# Patient Record
Sex: Female | Born: 1995
Health system: Southern US, Community
[De-identification: ages and names within clinical notes are randomized; demographics above are authoritative.]

## PROBLEM LIST (undated history)

## (undated) DIAGNOSIS — J45909 Unspecified asthma, uncomplicated: Secondary | ICD-10-CM

---

## 2013-06-24 ENCOUNTER — Encounter (HOSPITAL_COMMUNITY): Payer: Self-pay | Admitting: Adult Health

## 2013-06-24 DIAGNOSIS — F17200 Nicotine dependence, unspecified, uncomplicated: Secondary | ICD-10-CM | POA: Insufficient documentation

## 2013-06-24 DIAGNOSIS — Z3202 Encounter for pregnancy test, result negative: Secondary | ICD-10-CM | POA: Insufficient documentation

## 2013-06-24 DIAGNOSIS — R109 Unspecified abdominal pain: Secondary | ICD-10-CM | POA: Insufficient documentation

## 2013-06-24 DIAGNOSIS — J45909 Unspecified asthma, uncomplicated: Secondary | ICD-10-CM | POA: Insufficient documentation

## 2013-06-24 LAB — URINE MICROSCOPIC-ADD ON

## 2013-06-24 LAB — URINALYSIS, ROUTINE W REFLEX MICROSCOPIC
Glucose, UA: NEGATIVE mg/dL
Hgb urine dipstick: NEGATIVE
Ketones, ur: NEGATIVE mg/dL
Protein, ur: NEGATIVE mg/dL
pH: 7 (ref 5.0–8.0)

## 2013-06-24 NOTE — ED Notes (Signed)
Presents with bilateral lower quadrant abdominal pain that is intermittent for one week. Denies vaginal odor and discharge, reports difficulty urinating. Denies nausea and vomiting. LMP unknown

## 2013-06-25 ENCOUNTER — Emergency Department (HOSPITAL_COMMUNITY)
Admission: EM | Admit: 2013-06-25 | Discharge: 2013-06-25 | Disposition: A | Discharge status: Home / Self Care | Payer: Medicaid Other | Attending: Emergency Medicine | Admitting: Emergency Medicine

## 2013-06-25 DIAGNOSIS — R1030 Lower abdominal pain, unspecified: Secondary | ICD-10-CM

## 2013-06-25 HISTORY — DX: Unspecified asthma, uncomplicated: J45.909

## 2013-06-25 LAB — PREGNANCY, URINE: Preg Test, Ur: NEGATIVE

## 2013-06-25 NOTE — ED Notes (Signed)
Patient presents with a 1 week hx intermittent lower abdominal pain. Denies n/v, diarrhea, constipation, dysuria, hematuria, vaginal discharge or odor. No urinary frequency, urgency or hesitancy. Last Depo injection 4 months ago. LMP July, pt unsure/unable to approximate a date. Has not had menstrual cycle for the month of August. Patient did not have scheduled depo last month and has decided not to. Currently not on any birth control.

## 2013-06-25 NOTE — ED Provider Notes (Signed)
Medical screening examination/treatment/procedure(s) were performed by non-physician practitioner and as supervising physician I was immediately available for consultation/collaboration.  Sunnie Nielsen, MD 06/25/13 [phone]

## 2013-06-25 NOTE — ED Provider Notes (Signed)
CSN: [id number]     Arrival date & time 06/24/13  2101 History   First MD Initiated Contact with Patient 06/25/13 0103     Chief Complaint  Patient presents with  . Abdominal Pain   (Consider location/radiation/quality/duration/timing/severity/associated sxs/prior Treatment) HPI Comments: Patient reports, that she has a short stabbing pain in her suprapubic area 2-3 times a day, lasting for a second or 2 for the past, week.  Denies any vaginal discharge, dysuria, constipation, nausea, vomiting, recent illnesses, states, that she had a normal pelvic exam 2, weeks, ago by her primary care physician.  She has opted not to use any birth control.  Other than condoms.  She has not concern for an STD  Patient is a 17 y.o. female presenting with abdominal pain. The history is provided by the patient.  Abdominal Pain Pain location:  Suprapubic Pain quality: sharp   Pain radiates to:  Does not radiate Pain severity:  No pain Onset quality:  Unable to specify Duration:  1 week Timing:  Sporadic Chronicity:  New Context: not awakening from sleep, not diet changes, not eating, not laxative use, not medication withdrawal, not previous surgeries, not recent illness, not recent sexual activity, not recent travel, not retching, not sick contacts, not suspicious food intake and not trauma   Relieved by:  None tried Associated symptoms: no constipation, no diarrhea, no dysuria, no fever, no nausea, no vaginal discharge and no vomiting     Past Medical History  Diagnosis Date  . Asthma    History reviewed. No pertinent past surgical history. History reviewed. No pertinent family history. History  Substance Use Topics  . Smoking status: Current Every Day Smoker    Types: Cigarettes  . Smokeless tobacco: Not on file  . Alcohol Use: No   OB History   Grav Para Term Preterm Abortions TAB SAB Ect Mult Living                 Review of Systems  Constitutional: Negative for fever.  Gastrointestinal:  Positive for abdominal pain. Negative for nausea, vomiting, diarrhea, constipation and blood in stool.  Genitourinary: Negative for dysuria, urgency, frequency, flank pain, decreased urine volume, vaginal discharge, genital sores, menstrual problem, pelvic pain and dyspareunia.  Musculoskeletal: Negative for myalgias.  All other systems reviewed and are negative.    Allergies  Shellfish allergy  Home Medications  No current outpatient prescriptions on file. BP 101/69  Pulse 61  Temp(Src) 98.3 F (36.8 C) (Oral)  Resp 14  SpO2 100% Physical Exam  Nursing note and vitals reviewed. Constitutional: She appears well-developed and well-nourished.  HENT:  Head: Normocephalic.  Eyes: Pupils are equal, round, and reactive to light.  Neck: Normal range of motion.  Cardiovascular: Normal rate.   Pulmonary/Chest: Effort normal.  Abdominal: Soft. Bowel sounds are normal. She exhibits no distension. There is tenderness in the suprapubic area.    Neurological: She is alert.  Skin: Skin is warm.    ED Course  Procedures (including critical care time) Labs Review Labs Reviewed  URINALYSIS, ROUTINE W REFLEX MICROSCOPIC - Abnormal; Notable for the following:    Leukocytes, UA MODERATE (*)    All other components within normal limits  URINE MICROSCOPIC-ADD ON - Abnormal; Notable for the following:    Squamous Epithelial / LPF FEW (*)    All other components within normal limits  PREGNANCY, URINE   Imaging Review No results found.  MDM   1. Suprapubic pain, unspecified laterality    Does  not appear ill or toxic in nature.  Vital signs are stable.  She is afebrile Reviewed.  Normal urine with patient offered pelvic exam, she, states she would rather followup with her primary care physician    Arman Filter, NP 06/25/13 0153

## 2014-05-21 ENCOUNTER — Encounter (HOSPITAL_BASED_OUTPATIENT_CLINIC_OR_DEPARTMENT_OTHER): Payer: Self-pay | Admitting: Emergency Medicine

## 2014-05-21 ENCOUNTER — Emergency Department (HOSPITAL_BASED_OUTPATIENT_CLINIC_OR_DEPARTMENT_OTHER)
Admission: EM | Admit: 2014-05-21 | Discharge: 2014-05-21 | Discharge status: Against Medical Advice | Payer: Medicaid Other | Attending: Emergency Medicine | Admitting: Emergency Medicine

## 2014-05-21 DIAGNOSIS — O9989 Other specified diseases and conditions complicating pregnancy, childbirth and the puerperium: Secondary | ICD-10-CM | POA: Insufficient documentation

## 2014-05-21 DIAGNOSIS — F17200 Nicotine dependence, unspecified, uncomplicated: Secondary | ICD-10-CM | POA: Insufficient documentation

## 2014-05-21 DIAGNOSIS — J45909 Unspecified asthma, uncomplicated: Secondary | ICD-10-CM | POA: Diagnosis not present

## 2014-05-21 DIAGNOSIS — R109 Unspecified abdominal pain: Secondary | ICD-10-CM | POA: Diagnosis not present

## 2014-05-21 LAB — URINALYSIS, ROUTINE W REFLEX MICROSCOPIC
Bilirubin Urine: NEGATIVE
GLUCOSE, UA: NEGATIVE mg/dL
Ketones, ur: NEGATIVE mg/dL
LEUKOCYTES UA: NEGATIVE
Nitrite: NEGATIVE
PH: 6.5 (ref 5.0–8.0)
Protein, ur: NEGATIVE mg/dL
Specific Gravity, Urine: 1.011 (ref 1.005–1.030)
Urobilinogen, UA: 2 mg/dL — ABNORMAL HIGH (ref 0.0–1.0)

## 2014-05-21 LAB — URINE MICROSCOPIC-ADD ON

## 2014-05-21 LAB — PREGNANCY, URINE: PREG TEST UR: NEGATIVE

## 2014-05-21 NOTE — ED Notes (Signed)
Pt called to be taken to exam room with no answer. Pt not found in waiting room, cantina area, or waiting room bathrooms.

## 2014-05-21 NOTE — ED Notes (Signed)
Checked waiting area again for pt to take her to exam room. Pt not located.

## 2014-05-21 NOTE — ED Notes (Signed)
Pt recently discovered she is pregnant. At apprx. 1:00 am today, she began having lower abd pain and vaginal bleeding.

## 2014-08-14 ENCOUNTER — Emergency Department (HOSPITAL_BASED_OUTPATIENT_CLINIC_OR_DEPARTMENT_OTHER)
Admission: EM | Admit: 2014-08-14 | Discharge: 2014-08-14 | Disposition: A | Discharge status: Home / Self Care | Payer: Medicaid Other | Attending: Emergency Medicine | Admitting: Emergency Medicine

## 2014-08-14 ENCOUNTER — Emergency Department (HOSPITAL_BASED_OUTPATIENT_CLINIC_OR_DEPARTMENT_OTHER): Payer: Medicaid Other

## 2014-08-14 ENCOUNTER — Encounter (HOSPITAL_BASED_OUTPATIENT_CLINIC_OR_DEPARTMENT_OTHER): Payer: Self-pay | Admitting: Emergency Medicine

## 2014-08-14 DIAGNOSIS — J45909 Unspecified asthma, uncomplicated: Secondary | ICD-10-CM | POA: Insufficient documentation

## 2014-08-14 DIAGNOSIS — Z791 Long term (current) use of non-steroidal anti-inflammatories (NSAID): Secondary | ICD-10-CM | POA: Insufficient documentation

## 2014-08-14 DIAGNOSIS — M79641 Pain in right hand: Secondary | ICD-10-CM | POA: Diagnosis present

## 2014-08-14 DIAGNOSIS — Z720 Tobacco use: Secondary | ICD-10-CM | POA: Diagnosis not present

## 2014-08-14 LAB — CBC
HEMATOCRIT: 38.7 % (ref 36.0–46.0)
HEMOGLOBIN: 12.5 g/dL (ref 12.0–15.0)
MCH: 30.2 pg (ref 26.0–34.0)
MCHC: 32.3 g/dL (ref 30.0–36.0)
MCV: 93.5 fL (ref 78.0–100.0)
Platelets: 263 10*3/uL (ref 150–400)
RBC: 4.14 MIL/uL (ref 3.87–5.11)
RDW: 13.2 % (ref 11.5–15.5)
WBC: 7.7 10*3/uL (ref 4.0–10.5)

## 2014-08-14 LAB — SEDIMENTATION RATE: SED RATE: 5 mm/h (ref 0–22)

## 2014-08-14 LAB — BASIC METABOLIC PANEL
Anion gap: 15 (ref 5–15)
BUN: 15 mg/dL (ref 6–23)
CALCIUM: 9.3 mg/dL (ref 8.4–10.5)
CO2: 23 meq/L (ref 19–32)
CREATININE: 0.7 mg/dL (ref 0.50–1.10)
Chloride: 102 mEq/L (ref 96–112)
GFR calc Af Amer: >90 mL/min (ref >90)
GLUCOSE: 89 mg/dL (ref 70–99)
Potassium: 3.9 mEq/L (ref 3.7–5.3)
SODIUM: 140 meq/L (ref 137–147)

## 2014-08-14 MED ORDER — IBUPROFEN 800 MG PO TABS: 800.0000 mg | ORAL_TABLET | Freq: Three times a day (TID) | ORAL | Status: DC

## 2014-08-14 NOTE — Discharge Instructions (Signed)
Take ibuprofen as directed. Keep your hand elevated and ice.  Musculoskeletal Pain Musculoskeletal pain is muscle and boney aches and pains. These pains can occur in any part of the body. Your caregiver may treat you without knowing the cause of the pain. They may treat you if blood or urine tests, X-rays, and other tests were normal.  CAUSES There is often not a definite cause or reason for these pains. These pains may be caused by a type of germ (virus). The discomfort may also come from overuse. Overuse includes working out too hard when your body is not fit. Boney aches also come from weather changes. Bone is sensitive to atmospheric pressure changes. HOME CARE INSTRUCTIONS   Ask when your test results will be ready. Make sure you get your test results.  Only take over-the-counter or prescription medicines for pain, discomfort, or fever as directed by your caregiver. If you were given medications for your condition, do not drive, operate machinery or power tools, or sign legal documents for 24 hours. Do not drink alcohol. Do not take sleeping pills or other medications that may interfere with treatment.  Continue all activities unless the activities cause more pain. When the pain lessens, slowly resume normal activities. Gradually increase the intensity and duration of the activities or exercise.  During periods of severe pain, bed rest may be helpful. Lay or sit in any position that is comfortable.  Putting ice on the injured area.  Put ice in a bag.  Place a towel between your skin and the bag.  Leave the ice on for 15 to 20 minutes, 3 to 4 times a day.  Follow up with your caregiver for continued problems and no reason can be found for the pain. If the pain becomes worse or does not go away, it may be necessary to repeat tests or do additional testing. Your caregiver may need to look further for a possible cause. SEEK IMMEDIATE MEDICAL CARE IF:  You have pain that is getting worse  and is not relieved by medications.  You develop chest pain that is associated with shortness or breath, sweating, feeling sick to your stomach (nauseous), or throw up (vomit).  Your pain becomes localized to the abdomen.  You develop any new symptoms that seem different or that concern you. MAKE SURE YOU:   Understand these instructions.  Will watch your condition.  Will get help right away if you are not doing well or get worse. Document Released: 10/14/2005 Document Revised: 01/06/2012 Document Reviewed: 06/18/2013 [facility] Patient Information 2015 [employer], [place]. This information is not intended to replace advice given to you by your health care provider. Make sure you discuss any questions you have with your health care provider. RICE: Routine Care for Injuries The routine care of many injuries includes Rest, Ice, Compression, and Elevation (RICE). HOME CARE INSTRUCTIONS  Rest is needed to allow your body to heal. Routine activities can usually be resumed when comfortable. Injured tendons and bones can take up to 6 weeks to heal. Tendons are the cord-like structures that attach muscle to bone.  Ice following an injury helps keep the swelling down and reduces pain.  Put ice in a plastic bag.  Place a towel between your skin and the bag.  Leave the ice on for 15-20 minutes, 3-4 times a day, or as directed by your health care provider. Do this while awake, for the first 24 to 48 hours. After that, continue as directed by your caregiver.  Compression  helps keep swelling down. It also gives support and helps with discomfort. If an elastic bandage has been applied, it should be removed and reapplied every 3 to 4 hours. It should not be applied tightly, but firmly enough to keep swelling down. Watch fingers or toes for swelling, bluish discoloration, coldness, numbness, or excessive pain. If any of these problems occur, remove the bandage and reapply loosely. Contact your caregiver if  these problems continue.  Elevation helps reduce swelling and decreases pain. With extremities, such as the arms, hands, legs, and feet, the injured area should be placed near or above the level of the heart, if possible. SEEK IMMEDIATE MEDICAL CARE IF:  You have persistent pain and swelling.  You develop redness, numbness, or unexpected weakness.  Your symptoms are getting worse rather than improving after several days. These symptoms may indicate that further evaluation or further X-rays are needed. Sometimes, X-rays may not show a small broken bone (fracture) until 1 week or 10 days later. Make a follow-up appointment with your caregiver. Ask when your X-ray results will be ready. Make sure you get your X-ray results. Document Released: 01/26/2001 Document Revised: 10/19/2013 Document Reviewed: 03/15/2011 [facility] Patient Information 2015 [place], [place]. This information is not intended to replace advice given to you by your health care provider. Make sure you discuss any questions you have with your health care provider.

## 2014-08-14 NOTE — ED Notes (Signed)
PA at bedside.

## 2014-08-14 NOTE — ED Provider Notes (Signed)
CSN: [id number]     Arrival date & time 08/14/14  1529 History   First MD Initiated Contact with Patient 08/14/14 1556     Chief Complaint  Patient presents with  . Hand Pain     (Consider location/radiation/quality/duration/timing/severity/associated sxs/prior Treatment) HPI Comments: This is a 18 year old female with a past medical history of asthma who presents to the emergency department complaining of right hand swelling, pain weakness x2 days. Patient reports throughout the day 2 days ago she started to notice her hand is becoming weaker, and increasingly swollen. Pain worse when she touches her hand or if she tries to make a fist. She tried taking ibuprofen with minimal relief. No known injury or trauma. Denies fever, chills, wounds or skin color change.  Patient is a 18 y.o. female presenting with hand pain. The history is provided by the patient.  Hand Pain    Past Medical History  Diagnosis Date  . Asthma    History reviewed. No pertinent past surgical history. No family history on file. History  Substance Use Topics  . Smoking status: Current Every Day Smoker    Types: Cigarettes  . Smokeless tobacco: Not on file  . Alcohol Use: No   OB History   Grav Para Term Preterm Abortions TAB SAB Ect Mult Living                 Review of Systems 10 Systems reviewed and are negative for acute change except as noted in the HPI.    Allergies  Shellfish allergy  Home Medications   Prior to Admission medications   Medication Sig Start Date End Date Taking? Authorizing Provider  ibuprofen (ADVIL,MOTRIN) 800 MG tablet Take 1 tablet (800 mg total) by mouth 3 (three) times daily. 08/14/14   Keamber Macfadden M Fulton Merry, PA-C   BP 114/55  Pulse 60  Temp(Src) 98 F (36.7 C) (Oral)  Resp 20  Ht 5\' 6"  (1.676 m)  SpO2 100%  LMP 07/19/2014 Physical Exam  Nursing note and vitals reviewed. Constitutional: She is oriented to person, place, and time. She appears well-developed and  well-nourished. No distress.  HENT:  Head: Normocephalic and atraumatic.  Mouth/Throat: Oropharynx is clear and moist.  Eyes: Conjunctivae and EOM are normal.  Neck: Normal range of motion. Neck supple.  Cardiovascular: Normal rate, regular rhythm and normal heart sounds.   Pulmonary/Chest: Effort normal and breath sounds normal. No respiratory distress.  Musculoskeletal:  R hand with mild edema throughout, most prominent over 2-5 MCP. Tender. No erythema or warmth. ROM limited by pain to MCPs. Cap refill < 3 seconds. Sensation intact.  Neurological: She is alert and oriented to person, place, and time. No sensory deficit.  Skin: Skin is warm and dry.  Psychiatric: She has a normal mood and affect. Her behavior is normal.    ED Course  Procedures (including critical care time) Labs Review Labs Reviewed  CBC  BASIC METABOLIC PANEL  SEDIMENTATION RATE    Imaging Review Dg Hand Complete Right  08/14/2014   CLINICAL DATA:  Posterior right hand pain and swelling. No known injury. Initial encounter.  EXAM: RIGHT HAND - COMPLETE 3+ VIEW  COMPARISON:  None.  FINDINGS: Imaged bones, joints and soft tissues appear normal.  IMPRESSION: Negative exam.   Electronically Signed   By: Drusilla Kannerhomas  Dalessio M.D.   On: 08/14/2014 16:15     EKG Interpretation None      MDM   Final diagnoses:  Right hand pain   Pt non-toxic  appearing and in NAD. AFVSS. Neurovascularly intact. No injury or trauma. No erythema or warmth concerning for infection. No leukocytosis, normal sed rate. Advised RICE, NSAIDs. velcro splint applied. Resources given for f/u. Stable for d/c. Return precautions given. Patient states understanding of treatment care plan and is agreeable.   Kathrynn SpeedRobyn M Lorrinda Ramstad, PA-C 08/14/14 1810

## 2014-08-14 NOTE — ED Notes (Addendum)
Right hand swollen and weak. Denies injury

## 2014-08-16 NOTE — ED Provider Notes (Signed)
Medical screening examination/treatment/procedure(s) were performed by non-physician practitioner and as supervising physician I was immediately available for consultation/collaboration.   EKG Interpretation None       Arby BarretteMarcy Bernadetta Roell, MD 08/16/14 [phone]

## 2015-11-04 ENCOUNTER — Encounter (HOSPITAL_BASED_OUTPATIENT_CLINIC_OR_DEPARTMENT_OTHER): Payer: Self-pay

## 2015-11-04 ENCOUNTER — Emergency Department (HOSPITAL_BASED_OUTPATIENT_CLINIC_OR_DEPARTMENT_OTHER)
Admission: EM | Admit: 2015-11-04 | Discharge: 2015-11-04 | Disposition: A | Discharge status: Home / Self Care | Payer: Medicaid Other | Attending: Emergency Medicine | Admitting: Emergency Medicine

## 2015-11-04 DIAGNOSIS — J45909 Unspecified asthma, uncomplicated: Secondary | ICD-10-CM | POA: Diagnosis not present

## 2015-11-04 DIAGNOSIS — F17210 Nicotine dependence, cigarettes, uncomplicated: Secondary | ICD-10-CM | POA: Insufficient documentation

## 2015-11-04 DIAGNOSIS — X58XXXA Exposure to other specified factors, initial encounter: Secondary | ICD-10-CM | POA: Insufficient documentation

## 2015-11-04 DIAGNOSIS — R1110 Vomiting, unspecified: Secondary | ICD-10-CM | POA: Diagnosis not present

## 2015-11-04 DIAGNOSIS — Y998 Other external cause status: Secondary | ICD-10-CM | POA: Insufficient documentation

## 2015-11-04 DIAGNOSIS — Y9389 Activity, other specified: Secondary | ICD-10-CM | POA: Diagnosis not present

## 2015-11-04 DIAGNOSIS — Z3202 Encounter for pregnancy test, result negative: Secondary | ICD-10-CM | POA: Diagnosis not present

## 2015-11-04 DIAGNOSIS — Y9289 Other specified places as the place of occurrence of the external cause: Secondary | ICD-10-CM | POA: Diagnosis not present

## 2015-11-04 DIAGNOSIS — T192XXA Foreign body in vulva and vagina, initial encounter: Secondary | ICD-10-CM | POA: Diagnosis present

## 2015-11-04 DIAGNOSIS — Z791 Long term (current) use of non-steroidal anti-inflammatories (NSAID): Secondary | ICD-10-CM | POA: Diagnosis not present

## 2015-11-04 LAB — URINALYSIS, ROUTINE W REFLEX MICROSCOPIC
GLUCOSE, UA: NEGATIVE mg/dL
KETONES UR: 15 mg/dL — AB
LEUKOCYTES UA: NEGATIVE
Nitrite: NEGATIVE
PH: 6 (ref 5.0–8.0)
PROTEIN: NEGATIVE mg/dL
Specific Gravity, Urine: 1.028 (ref 1.005–1.030)

## 2015-11-04 LAB — URINE MICROSCOPIC-ADD ON

## 2015-11-04 LAB — WET PREP, GENITAL
SPERM: NONE SEEN
TRICH WET PREP: NONE SEEN
YEAST WET PREP: NONE SEEN

## 2015-11-04 LAB — PREGNANCY, URINE: Preg Test, Ur: NEGATIVE

## 2015-11-04 MED ORDER — ONDANSETRON HCL 4 MG/2ML IJ SOLN
4.0000 mg | Freq: Once | INTRAMUSCULAR | Status: AC
  Administered 2015-11-04: 4 mg via INTRAVENOUS
  Filled 2015-11-04: qty 2

## 2015-11-04 MED ORDER — SODIUM CHLORIDE 0.9 % IV BOLUS (SEPSIS)
1000.0000 mL | Freq: Once | INTRAVENOUS | Status: AC
  Administered 2015-11-04: 1000 mL via INTRAVENOUS

## 2015-11-04 MED ORDER — ONDANSETRON 8 MG PO TBDP: 8.0000 mg | ORAL_TABLET | Freq: Three times a day (TID) | ORAL | Status: AC | PRN

## 2015-11-04 MED ORDER — METRONIDAZOLE 500 MG PO TABS: 500.0000 mg | ORAL_TABLET | Freq: Two times a day (BID) | ORAL | Status: DC

## 2015-11-04 NOTE — Discharge Instructions (Signed)
Vaginal Foreign Body °A vaginal foreign body is any object that gets stuck or left inside the vagina. Vaginal foreign bodies left in the vagina for a long time can cause irritation and infection. In most cases, symptoms go away once the vaginal foreign body is found and removed. Rarely, a foreign object can break through the walls of the vagina and cause a serious infection inside the abdomen.  °CAUSES  °The most common vaginal foreign bodies are: °· Tampons. °· Contraceptive devices. °· Toilet tissue left in the vagina. °· Small objects that were placed in the vagina out of curiosity and got stuck. °· A result of sexual abuse.  °SIGNS AND SYMPTOMS °· Light vaginal bleeding. °· Blood-tinged vaginal fluid (discharge). °· Vaginal discharge that smells bad. °· Vaginal itching or burning. °· Redness, swelling, or rash near the opening of the vagina. °· Abdominal pain. °· Fever. °· Burning or frequent urination. °DIAGNOSIS  °Your health care provider may be able to diagnose a vaginal foreign body based on the information you provide, your symptoms, and a physical exam. Your health care provider may also perform the following tests to check for infection: °· A swab of the discharge to check under a microscope for bacteria (culture). °· A urine culture. °· An examination of the vagina with a small, lighted scope (vaginoscopy). °· Imaging tests to get a picture of the inside of your vagina, such as: °¨ Ultrasound. °¨ X-ray. °¨ MRI. °TREATMENT  °In most cases, a vaginal foreign body can be easily removed and the symptoms usually go away very quickly. Other treatment may include:  °· If the vaginal foreign body is not easily removed, medicine may be given to make you go to sleep (general anesthesia) to have the object removed. °· Emergency surgery may be necessary if an infection spreads through the walls of the vagina into the abdomen (acute abdomen). This is rare. °· You may need to take antibiotic medicine if you have a  vaginal or urinary tract infection. °HOME CARE INSTRUCTIONS  °· Take medicines only as directed by your health care provider. °· If you were prescribed an antibiotic medicine, finish it all even if you start to feel better. °· Do not have sex or use tampons until your health care provider says it is okay. °· Do not douche or use vaginal rinses unless your health care provider recommends it. °· Keep all follow-up visits as directed by your health care provider. This is important. °SEEK MEDICAL CARE IF: °· You have abdominal pain or burning pain when urinating. °· You have a fever. °SEEK IMMEDIATE MEDICAL CARE IF: °· You have heavy vaginal bleeding or discharge.   °· You have very bad abdominal pain.   °MAKE SURE YOU: °· Understand these instructions. °· Will watch your condition. °· Will get help right away if you are not doing well or get worse. °  °This information is not intended to replace advice given to you by your health care provider. Make sure you discuss any questions you have with your health care provider. °  °Document Released: 02/28/2014 Document Reviewed: 02/28/2014 °Elsevier Interactive Patient Education ©2016 [employer] Inc. ° °

## 2015-11-04 NOTE — ED Provider Notes (Addendum)
CSN: [id number]     Arrival date & time 11/04/15  0127 History   First MD Initiated Contact with Patient 11/04/15 0145     Chief Complaint  Patient presents with  . Foreign Body in Vagina    tampon     (Consider location/radiation/quality/duration/timing/severity/associated sxs/prior Treatment) HPI  This is a 20 year old female who has a retained tampon in her vagina for "several" days. She is no longer having menstrual bleeding. There is an abnormal vaginal odor. She is also having pelvic cramping for 2 days and had multiple episodes of vomiting yesterday. She continues to be nauseated. She has had no diarrhea or fever. Pelvic pain is moderate, worse with movement or palpation.  Past Medical History  Diagnosis Date  . Asthma    History reviewed. No pertinent past surgical history. No family history on file. Social History  Substance Use Topics  . Smoking status: Current Every Day Smoker    Types: Cigarettes  . Smokeless tobacco: None  . Alcohol Use: Yes     Comment: social    OB History    No data available     Review of Systems  All other systems reviewed and are negative.   Allergies  Shellfish allergy  Home Medications   Prior to Admission medications   Medication Sig Start Date End Date Taking? Authorizing Provider  ibuprofen (ADVIL,MOTRIN) 800 MG tablet Take 1 tablet (800 mg total) by mouth 3 (three) times daily. 08/14/14   Robyn M Hess, PA-C   BP 133/62 mmHg  Pulse 64  Temp(Src) 98.5 F (36.9 C) (Oral)  Resp 16  Ht 5\' 7"  (1.702 m)  Wt 198 lb (89.812 kg)  BMI 31.00 kg/m2  SpO2 99%  LMP 10/25/2015   Physical Exam  General: Well-developed, well-nourished female in no acute distress; appearance consistent with age of record HENT: normocephalic; atraumatic Eyes: pupils equal, round and reactive to light; extraocular muscles intact Neck: supple Heart: regular rate and rhythm Lungs: clear to auscultation bilaterally Abdomen: soft; nondistended; suprapubic  tenderness; no masses or hepatosplenomegaly; bowel sounds present GU: Normal external genitalia; for an object consistent with retained tampon seen in vaginal vault; abnormal vaginal odor Extremities: No deformity; full range of motion; pulses normal Neurologic: Awake, alert and oriented; motor function intact in all extremities and symmetric; no facial droop Skin: Warm and dry Psychiatric: Normal mood and affect    ED Course  Procedures (including critical care time)  FOREIGN BODY REMOVAL Foreign body consistent with a retained tampon was identified using the standard vaginal speculum. The object was removed with ring forceps. Some friability and light bleeding of the cervix was noted after removal. The patient tolerated this well and there were no immediate complications.  MDM  Nursing notes and vitals signs, including pulse oximetry, reviewed.  Summary of this visit's results, reviewed by myself:  Labs:  Results for orders placed or performed during the hospital encounter of 11/04/15 (from the past 24 hour(s))  Wet prep, genital     Status: Abnormal   Collection Time: 11/04/15  1:35 AM  Result Value Ref Range   Yeast Wet Prep HPF POC NONE SEEN NONE SEEN   Trich, Wet Prep NONE SEEN NONE SEEN   Clue Cells Wet Prep HPF POC PRESENT (A) NONE SEEN   WBC, Wet Prep HPF POC MODERATE (A) NONE SEEN   Sperm NONE SEEN   Urinalysis, Routine w reflex microscopic ([facility])     Status: Abnormal   Collection Time: 11/04/15  1:35 AM  Result Value Ref Range   Color, Urine AMBER (A) YELLOW   APPearance CLEAR CLEAR   Specific Gravity, Urine 1.028 1.005 - 1.030   pH 6.0 5.0 - 8.0   Glucose, UA NEGATIVE NEGATIVE mg/dL   Hgb urine dipstick TRACE (A) NEGATIVE   Bilirubin Urine SMALL (A) NEGATIVE   Ketones, ur 15 (A) NEGATIVE mg/dL   Protein, ur NEGATIVE NEGATIVE mg/dL   Nitrite NEGATIVE NEGATIVE   Leukocytes, UA NEGATIVE NEGATIVE  Pregnancy, urine     Status: None   Collection Time:  11/04/15  1:35 AM  Result Value Ref Range   Preg Test, Ur NEGATIVE NEGATIVE  Urine microscopic-add on     Status: Abnormal   Collection Time: 11/04/15  1:35 AM  Result Value Ref Range   Squamous Epithelial / LPF 0-5 (A) NONE SEEN   WBC, UA 0-5 0 - 5 WBC/hpf   RBC / HPF 0-5 0 - 5 RBC/hpf   Bacteria, UA RARE (A) NONE SEEN   Urine-Other MUCOUS PRESENT    2:36 AM Pelvic pain and tenderness significantly improved after removal of tampon.   3:22 AM Patient feeling better after Zofran and normal saline bolus.    Paula Libra, MD 11/04/15 0300  Paula Libra, MD 11/04/15 [phone]

## 2015-11-04 NOTE — ED Notes (Signed)
Pt states has a tampon stuck in vagina x5days, had sex 3days ago, states now having cramping and nausea

## 2015-11-06 LAB — GC/CHLAMYDIA PROBE AMP (~~LOC~~) NOT AT ARMC
CHLAMYDIA, DNA PROBE: NEGATIVE
NEISSERIA GONORRHEA: NEGATIVE

## 2016-01-17 ENCOUNTER — Encounter (HOSPITAL_BASED_OUTPATIENT_CLINIC_OR_DEPARTMENT_OTHER): Payer: Self-pay | Admitting: Emergency Medicine

## 2016-01-17 ENCOUNTER — Emergency Department (HOSPITAL_BASED_OUTPATIENT_CLINIC_OR_DEPARTMENT_OTHER)
Admission: EM | Admit: 2016-01-17 | Discharge: 2016-01-17 | Disposition: A | Discharge status: Home / Self Care | Payer: Medicaid Other | Attending: Emergency Medicine | Admitting: Emergency Medicine

## 2016-01-17 DIAGNOSIS — B9689 Other specified bacterial agents as the cause of diseases classified elsewhere: Secondary | ICD-10-CM

## 2016-01-17 DIAGNOSIS — Z791 Long term (current) use of non-steroidal anti-inflammatories (NSAID): Secondary | ICD-10-CM | POA: Insufficient documentation

## 2016-01-17 DIAGNOSIS — F17210 Nicotine dependence, cigarettes, uncomplicated: Secondary | ICD-10-CM | POA: Insufficient documentation

## 2016-01-17 DIAGNOSIS — N760 Acute vaginitis: Secondary | ICD-10-CM | POA: Diagnosis not present

## 2016-01-17 DIAGNOSIS — R102 Pelvic and perineal pain: Secondary | ICD-10-CM | POA: Diagnosis present

## 2016-01-17 DIAGNOSIS — Z3202 Encounter for pregnancy test, result negative: Secondary | ICD-10-CM | POA: Diagnosis not present

## 2016-01-17 DIAGNOSIS — J45909 Unspecified asthma, uncomplicated: Secondary | ICD-10-CM | POA: Insufficient documentation

## 2016-01-17 LAB — URINALYSIS, ROUTINE W REFLEX MICROSCOPIC
Bilirubin Urine: NEGATIVE
GLUCOSE, UA: NEGATIVE mg/dL
HGB URINE DIPSTICK: NEGATIVE
KETONES UR: 15 mg/dL — AB
Nitrite: NEGATIVE
PH: 6.5 (ref 5.0–8.0)
PROTEIN: 30 mg/dL — AB
Specific Gravity, Urine: 1.027 (ref 1.005–1.030)

## 2016-01-17 LAB — URINE MICROSCOPIC-ADD ON

## 2016-01-17 LAB — WET PREP, GENITAL
SPERM: NONE SEEN
Trich, Wet Prep: NONE SEEN
YEAST WET PREP: NONE SEEN

## 2016-01-17 LAB — PREGNANCY, URINE: PREG TEST UR: NEGATIVE

## 2016-01-17 MED ORDER — METRONIDAZOLE 500 MG PO TABS: 500.0000 mg | ORAL_TABLET | Freq: Two times a day (BID) | ORAL | Status: DC

## 2016-01-17 MED ORDER — AZITHROMYCIN 250 MG PO TABS
1000.0000 mg | ORAL_TABLET | Freq: Once | ORAL | Status: AC
  Administered 2016-01-17: 1000 mg via ORAL
  Filled 2016-01-17: qty 4

## 2016-01-17 MED ORDER — CEFTRIAXONE SODIUM 250 MG IJ SOLR
250.0000 mg | Freq: Once | INTRAMUSCULAR | Status: AC
  Administered 2016-01-17: 250 mg via INTRAMUSCULAR
  Filled 2016-01-17: qty 250

## 2016-01-17 MED FILL — metroNIDAZOLE 500 MG TABS: 500 | 7 days supply | Qty: 14 | Fill #0

## 2016-01-17 NOTE — ED Notes (Signed)
Patient states that she missed her monthly cycle, and has had pelvic pain x 2 weeks. The patient reports that she thought it was her cycle coming on

## 2016-01-17 NOTE — ED Provider Notes (Signed)
CSN: [id number]     Arrival date & time 01/17/16  1053 History   First MD Initiated Contact with Patient 01/17/16 1117     Chief Complaint  Patient presents with  . Pelvic Pain     (Consider location/radiation/quality/duration/timing/severity/associated sxs/prior Treatment) HPI   This is a 20 year old female who presents emergency Department with chief complaint of pelvic pain. Patient states it has been ongoing for about one month. She is associated urgency of urination, but denies being able to produce much urine. She denies any foul odor or vaginal discharge  She states that she is 2 days late for her menstrual period but states that her symptoms are different from menstrual cramping. She denies constipation, diarrhea. She does, hematuria or foul odor of urine. Past Medical History  Diagnosis Date  . Asthma    History reviewed. No pertinent past surgical history. History reviewed. No pertinent family history. Social History  Substance Use Topics  . Smoking status: Current Every Day Smoker    Types: Cigarettes  . Smokeless tobacco: None  . Alcohol Use: Yes     Comment: social    OB History    No data available     Review of Systems  Ten systems reviewed and are negative for acute change, except as noted in the HPI.    Allergies  Shellfish allergy  Home Medications   Prior to Admission medications   Medication Sig Start Date End Date Taking? Authorizing Provider  ibuprofen (ADVIL,MOTRIN) 800 MG tablet Take 1 tablet (800 mg total) by mouth 3 (three) times daily. 08/14/14   Robyn M Hess, PA-C  metroNIDAZOLE (FLAGYL) 500 MG tablet Take 1 tablet (500 mg total) by mouth 2 (two) times daily. One po bid x 7 days 01/17/16   Arthor Captain, PA-C  ondansetron (ZOFRAN ODT) 8 MG disintegrating tablet Take 1 tablet (8 mg total) by mouth every 8 (eight) hours as needed for nausea or vomiting. 11/04/15   John Molpus, MD   BP 120/71 mmHg  Pulse 85  Temp(Src) 98.2 F (36.8 C) (Oral)   Resp 18  Ht  (1.676 m)  Wt 91.173 kg  BMI 32.46 kg/m2  SpO2 100%  LMP 12/18/2015 Physical Exam Physical Exam  Nursing note and vitals reviewed. Constitutional: She is oriented to person, place, and time. She appears well-developed and well-nourished. No distress.  HENT:  Head: Normocephalic and atraumatic.  Eyes: Conjunctivae normal and EOM are normal. Pupils are equal, round, and reactive to light. No scleral icterus.  Neck: Normal range of motion.  Cardiovascular: Normal rate, regular rhythm and normal heart sounds.  Exam reveals no gallop and no friction rub.   No murmur heard. Pulmonary/Chest: Effort normal and breath sounds normal. No respiratory distress.  Abdominal: Soft. Bowel sounds are normal. She exhibits no distension and no mass. There is no tenderness. There is no guarding.  Neurological: She is alert and oriented to person, place, and time.  Skin: Skin is warm and dry. She is not diaphoretic.  GU: Normal external female genitalia without lesion, vaginal canal is pink with normal discharge. Cervix with ectropion, no discharge. No adnexal fullness, no cervical motion tenderness. Tenderness to palpation of the bladder.   ED Course  Procedures (including critical care time) Labs Review Labs Reviewed  WET PREP, GENITAL - Abnormal; Notable for the following:    Clue Cells Wet Prep HPF POC PRESENT (*)    WBC, Wet Prep HPF POC MANY (*)    All other components within  normal limits  URINALYSIS, ROUTINE W REFLEX MICROSCOPIC (NOT AT [facility]) - Abnormal; Notable for the following:    Color, Urine AMBER (*)    APPearance CLOUDY (*)    Ketones, ur 15 (*)    Protein, ur 30 (*)    Leukocytes, UA SMALL (*)    All other components within normal limits  URINE MICROSCOPIC-ADD ON - Abnormal; Notable for the following:    Squamous Epithelial / LPF 0-5 (*)    Bacteria, UA FEW (*)    All other components within normal limits  PREGNANCY, URINE  RPR  HIV ANTIBODY (ROUTINE TESTING)   GC/CHLAMYDIA PROBE AMP ([place]) NOT AT [facility]    Imaging Review No results found. I have personally reviewed and evaluated these images and lab results as part of my medical decision-making.   EKG Interpretation None      MDM   Final diagnoses:  Pelvic pain in female  Bacterial vaginosis    12:29 PM BP 120/71 mmHg  Pulse 85  Temp(Src) 98.2 F (36.8 C) (Oral)  Resp 18  Ht 5\' 6"  (1.676 m)  Wt 91.173 kg  BMI 32.46 kg/m2  SpO2 100%  LMP 12/18/2015 Patient's urine does not appear infected. She does have some vaginal contamination. We'll send it for culture. They'll. Patient does have clue cells and many white cells on her wet prep. We'll treat prophylactically for STI. The patient will be discharged with Flagyl. Patient will be referred to OB/GYN services for further treatment. Pierce safe for discharge at this time    Arthor Captainbigail Zarina Pe, PA-C 01/17/16 1229  Jerelyn ScottMartha Linker, MD 01/17/16 1230

## 2016-01-17 NOTE — ED Notes (Signed)
Patient asked to change into a gown, pelvic cart at bedside.

## 2016-01-17 NOTE — Discharge Instructions (Signed)
Pelvic Pain, Female Female pelvic pain can be caused by many different things and start from a variety of places. Pelvic pain refers to pain that is located in the lower half of the abdomen and between your hips. The pain may occur over a short period of time (acute) or may be reoccurring (chronic). The cause of pelvic pain may be related to disorders affecting the female reproductive organs (gynecologic), but it may also be related to the bladder, kidney stones, an intestinal complication, or muscle or skeletal problems. Getting help right away for pelvic pain is important, especially if there has been severe, sharp, or a sudden onset of unusual pain. It is also important to get help right away because some types of pelvic pain can be life threatening.  CAUSES  Below are only some of the causes of pelvic pain. The causes of pelvic pain can be in one of several categories.   Gynecologic.  Pelvic inflammatory disease.  Sexually transmitted infection.  Ovarian cyst or a twisted ovarian ligament (ovarian torsion).  Uterine lining that grows outside the uterus (endometriosis).  Fibroids, cysts, or tumors.  Ovulation.  Pregnancy.  Pregnancy that occurs outside the uterus (ectopic pregnancy).  Miscarriage.  Labor.  Abruption of the placenta or ruptured uterus.  Infection.  Uterine infection (endometritis).  Bladder infection.  Diverticulitis.  Miscarriage related to a uterine infection (septic abortion).  Bladder.  Inflammation of the bladder (cystitis).  Kidney stone(s).  Gastrointestinal.  Constipation.  Diverticulitis.  Neurologic.  Trauma.  Feeling pelvic pain because of mental or emotional causes (psychosomatic).  Cancers of the bowel or pelvis. EVALUATION  Your caregiver will want to take a careful history of your concerns. This includes recent changes in your health, a careful gynecologic history of your periods (menses), and a sexual history. Obtaining  your family history and medical history is also important. Your caregiver may suggest a pelvic exam. A pelvic exam will help identify the location and severity of the pain. It also helps in the evaluation of which organ system may be involved. In order to identify the cause of the pelvic pain and be properly treated, your caregiver may order tests. These tests may include:   A pregnancy test.  Pelvic ultrasonography.  An X-ray exam of the abdomen.  A urinalysis or evaluation of vaginal discharge.  Blood tests. HOME CARE INSTRUCTIONS   Only take over-the-counter or prescription medicines for pain, discomfort, or fever as directed by your caregiver.   Rest as directed by your caregiver.   Eat a balanced diet.   Drink enough fluids to make your urine clear or pale yellow, or as directed.   Avoid sexual intercourse if it causes pain.   Apply warm or cold compresses to the lower abdomen depending on which one helps the pain.   Avoid stressful situations.   Keep a journal of your pelvic pain. Write down when it started, where the pain is located, and if there are things that seem to be associated with the pain, such as food or your menstrual cycle.  Follow up with your caregiver as directed.  SEEK MEDICAL CARE IF:  Your medicine does not help your pain.  You have abnormal vaginal discharge. SEEK IMMEDIATE MEDICAL CARE IF:   You have heavy bleeding from the vagina.   Your pelvic pain increases.   You feel light-headed or faint.   You have chills.   You have pain with urination or blood in your urine.   You have uncontrolled  diarrhea or vomiting.   You have a fever or persistent symptoms for more than 3 days.  You have a fever and your symptoms suddenly get worse.   You are being physically or sexually abused.   This information is not intended to replace advice given to you by your health care provider. Make sure you discuss any questions you have with  your health care provider.   Document Released: 09/10/2004 Document Revised: 07/05/2015 Document Reviewed: 02/03/2012 Elsevier Interactive Patient Education 2016 Elsevier Inc. Bacterial Vaginosis Bacterial vaginosis is a vaginal infection that occurs when the normal balance of bacteria in the vagina is disrupted. It results from an overgrowth of certain bacteria. This is the most common vaginal infection in women of childbearing age. Treatment is important to prevent complications, especially in pregnant women, as it can cause a premature delivery. CAUSES  Bacterial vaginosis is caused by an increase in harmful bacteria that are normally present in smaller amounts in the vagina. Several different kinds of bacteria can cause bacterial vaginosis. However, the reason that the condition develops is not fully understood. RISK FACTORS Certain activities or behaviors can put you at an increased risk of developing bacterial vaginosis, including:  Having a new sex partner or multiple sex partners.  Douching.  Using an intrauterine device (IUD) for contraception. Women do not get bacterial vaginosis from toilet seats, bedding, swimming pools, or contact with objects around them. SIGNS AND SYMPTOMS  Some women with bacterial vaginosis have no signs or symptoms. Common symptoms include:  Grey vaginal discharge.  A fishlike odor with discharge, especially after sexual intercourse.  Itching or burning of the vagina and vulva.  Burning or pain with urination. DIAGNOSIS  Your health care provider will take a medical history and examine the vagina for signs of bacterial vaginosis. A sample of vaginal fluid may be taken. Your health care provider will look at this sample under a microscope to check for bacteria and abnormal cells. A vaginal pH test may also be done.  TREATMENT  Bacterial vaginosis may be treated with antibiotic medicines. These may be given in the form of a pill or a vaginal cream. A  second round of antibiotics may be prescribed if the condition comes back after treatment. Because bacterial vaginosis increases your risk for sexually transmitted diseases, getting treated can help reduce your risk for chlamydia, gonorrhea, HIV, and herpes. HOME CARE INSTRUCTIONS   Only take over-the-counter or prescription medicines as directed by your health care provider.  If antibiotic medicine was prescribed, take it as directed. Make sure you finish it even if you start to feel better.  Tell all sexual partners that you have a vaginal infection. They should see their health care provider and be treated if they have problems, such as a mild rash or itching.  During treatment, it is important that you follow these instructions:  Avoid sexual activity or use condoms correctly.  Do not douche.  Avoid alcohol as directed by your health care provider.  Avoid breastfeeding as directed by your health care provider. SEEK MEDICAL CARE IF:   Your symptoms are not improving after 3 days of treatment.  You have increased discharge or pain.  You have a fever. MAKE SURE YOU:   Understand these instructions.  Will watch your condition.  Will get help right away if you are not doing well or get worse. FOR MORE INFORMATION  Centers for Disease Control and Prevention, Division of STD Prevention: www.cdc.gov/std American Sexual Health Association (ASHA): www.ashastd.org      This information is not intended to replace advice given to you by your health care provider. Make sure you discuss any questions you have with your health care provider.   Document Released: 10/14/2005 Document Revised: 11/04/2014 Document Reviewed: 05/26/2013 Elsevier Interactive Patient Education 2016 Elsevier Inc.  

## 2016-01-18 LAB — GC/CHLAMYDIA PROBE AMP (~~LOC~~) NOT AT ARMC
CHLAMYDIA, DNA PROBE: NEGATIVE
NEISSERIA GONORRHEA: NEGATIVE

## 2016-01-19 LAB — HIV ANTIBODY (ROUTINE TESTING W REFLEX): HIV Screen 4th Generation wRfx: NONREACTIVE

## 2016-01-20 LAB — RPR: RPR Ser Ql: NONREACTIVE

## 2016-04-12 ENCOUNTER — Emergency Department (HOSPITAL_COMMUNITY)
Admission: EM | Admit: 2016-04-12 | Discharge: 2016-04-12 | Disposition: A | Discharge status: Home / Self Care | Payer: No Typology Code available for payment source | Attending: Emergency Medicine | Admitting: Emergency Medicine

## 2016-04-12 ENCOUNTER — Encounter (HOSPITAL_COMMUNITY): Payer: Self-pay | Admitting: Emergency Medicine

## 2016-04-12 DIAGNOSIS — Y92410 Unspecified street and highway as the place of occurrence of the external cause: Secondary | ICD-10-CM | POA: Insufficient documentation

## 2016-04-12 DIAGNOSIS — Y999 Unspecified external cause status: Secondary | ICD-10-CM | POA: Diagnosis not present

## 2016-04-12 DIAGNOSIS — Z3A01 Less than 8 weeks gestation of pregnancy: Secondary | ICD-10-CM | POA: Diagnosis not present

## 2016-04-12 DIAGNOSIS — O9A211 Injury, poisoning and certain other consequences of external causes complicating pregnancy, first trimester: Secondary | ICD-10-CM | POA: Diagnosis not present

## 2016-04-12 DIAGNOSIS — F17210 Nicotine dependence, cigarettes, uncomplicated: Secondary | ICD-10-CM | POA: Insufficient documentation

## 2016-04-12 DIAGNOSIS — O99331 Smoking (tobacco) complicating pregnancy, first trimester: Secondary | ICD-10-CM | POA: Insufficient documentation

## 2016-04-12 DIAGNOSIS — J45909 Unspecified asthma, uncomplicated: Secondary | ICD-10-CM | POA: Diagnosis not present

## 2016-04-12 DIAGNOSIS — R109 Unspecified abdominal pain: Secondary | ICD-10-CM

## 2016-04-12 DIAGNOSIS — R1030 Lower abdominal pain, unspecified: Secondary | ICD-10-CM | POA: Insufficient documentation

## 2016-04-12 DIAGNOSIS — Y939 Activity, unspecified: Secondary | ICD-10-CM | POA: Insufficient documentation

## 2016-04-12 MED ORDER — ACETAMINOPHEN 325 MG PO TABS
650.0000 mg | ORAL_TABLET | Freq: Once | ORAL | Status: AC
  Administered 2016-04-12: 650 mg via ORAL
  Filled 2016-04-12: qty 2

## 2016-04-12 NOTE — Discharge Instructions (Signed)
If you were given medicines take as directed.  If you are on coumadin or contraceptives realize their levels and effectiveness is altered by many different medicines.  If you have any reaction (rash, tongues swelling, other) to the medicines stop taking and see a physician.    If your blood pressure was elevated in the ER make sure you follow up for management with a primary doctor or return for chest pain, vomiting, passing out, shortness of breath or stroke symptoms.  Please follow up as directed and return to the ER or see a physician for new or worsening symptoms.  Thank you. Filed Vitals:   04/12/16 2227 04/12/16 2230  BP: 125/68   Pulse: 87   Temp: 98.5 F (36.9 C)   TempSrc: Oral   Resp: 18   Weight: 199 lb 8 oz (90.493 kg)   SpO2: 100% 98%
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
## 2018-05-07 ENCOUNTER — Other Ambulatory Visit: Payer: Self-pay

## 2018-05-07 ENCOUNTER — Emergency Department (HOSPITAL_BASED_OUTPATIENT_CLINIC_OR_DEPARTMENT_OTHER)
Admission: EM | Admit: 2018-05-07 | Discharge: 2018-05-07 | Disposition: A | Discharge status: Home / Self Care | Payer: Medicaid Other | Attending: Emergency Medicine | Admitting: Emergency Medicine

## 2018-05-07 ENCOUNTER — Encounter (HOSPITAL_BASED_OUTPATIENT_CLINIC_OR_DEPARTMENT_OTHER): Payer: Self-pay | Admitting: Emergency Medicine

## 2018-05-07 DIAGNOSIS — J45909 Unspecified asthma, uncomplicated: Secondary | ICD-10-CM | POA: Diagnosis not present

## 2018-05-07 DIAGNOSIS — L02411 Cutaneous abscess of right axilla: Secondary | ICD-10-CM | POA: Insufficient documentation

## 2018-05-07 DIAGNOSIS — F17210 Nicotine dependence, cigarettes, uncomplicated: Secondary | ICD-10-CM | POA: Diagnosis not present

## 2018-05-07 MED ORDER — LIDOCAINE-EPINEPHRINE (PF) 2 %-1:200000 IJ SOLN
10.0000 mL | Freq: Once | INTRAMUSCULAR | Status: AC
  Administered 2018-05-07: 10 mL via INTRADERMAL
  Filled 2018-05-07 (×2): qty 10

## 2018-05-07 NOTE — ED Provider Notes (Signed)
[facility] EMERGENCY DEPARTMENT Provider Note   CSN: [id number] Arrival date & time: 05/07/18  1145     History   Chief Complaint Chief Complaint  Patient presents with  . Abscess    HPI Nicole Nolan is a 22 y.o. female.  HPI Nicole Nolan is a 22 y.o. female with history of asthma, presents to emergency department complaining of right axillary abscess.  Noticed abscess 2 weeks ago as a small bump.  States gradually got larger.  Today she squeezed it and drained some bloody purulent drainage.  No fever or chills.  Reports this is a recurrent issue.  No generalized malaise.  No other complaints.  Past Medical History:  Diagnosis Date  . Asthma     There are no active problems to display for this patient.   History reviewed. No pertinent surgical history.   OB History    Gravida  1   Para      Term      Preterm      AB      Living        SAB      TAB      Ectopic      Multiple      Live Births               Home Medications    Prior to Admission medications   Medication Sig Start Date End Date Taking? Authorizing Provider  albuterol (PROVENTIL HFA;VENTOLIN HFA) 108 (90 Base) MCG/ACT inhaler Inhale into the lungs every 6 (six) hours as needed for wheezing or shortness of breath.    [provider]  albuterol (PROVENTIL) (2.5 MG/3ML) 0.083% nebulizer solution Take 2.5 mg by nebulization every 6 (six) hours as needed for wheezing or shortness of breath.    [provider]  ondansetron (ZOFRAN ODT) 8 MG disintegrating tablet Take 1 tablet (8 mg total) by mouth every 8 (eight) hours as needed for nausea or vomiting. 11/04/15   Molpus, John, MD    Family History No family history on file.  Social History Social History   Tobacco Use  . Smoking status: Current Every Day Smoker    Types: Cigarettes  . Smokeless tobacco: Never Used  Substance Use Topics  . Alcohol use: Yes    Comment: social   . Drug  use: No     Allergies   Shellfish allergy   Review of Systems Review of Systems  Constitutional: Negative for chills and fever.  Skin: Positive for wound. Negative for color change.  Neurological: Negative for weakness and headaches.  All other systems reviewed and are negative.    Physical Exam Updated Vital Signs BP 125/63 (BP Location: Left Arm)   Pulse 74   Temp 98.2 F (36.8 C) (Oral)   Resp 18   Ht 5\' 5"  (1.651 m)   Wt 95.3 kg (210 lb)   SpO2 99%   Breastfeeding? Unknown   BMI 34.95 kg/m   Physical Exam  Constitutional: She appears well-developed and well-nourished. No distress.  Eyes: Conjunctivae are normal.  Neck: Neck supple.  Neurological: She is alert.  Skin: Skin is warm and dry.  2x2cm abscess to the right axially with central opening, not draining at this time. Minimal fluctuance. Ttp. No surrounding erythema  Nursing note and vitals reviewed.    ED Treatments / Results  Labs (all labs ordered are listed, but only abnormal results are displayed) Labs Reviewed - No data to display  EKG None  Radiology No results found.  Procedures .Marland Kitchen.Incision and Drainage Date/Time: 05/07/2018 1:50 PM Performed by: Jaynie CrumbleKirichenko, Purvis Sidle, PA-C Authorized by: Jaynie CrumbleKirichenko, Asaf Elmquist, PA-C   Consent:    Consent obtained:  Verbal   Consent given by:  Patient   Risks discussed:  Bleeding, incomplete drainage and pain Location:    Type:  Abscess   Location:  Upper extremity   Upper extremity location:  Arm   Arm location:  R upper arm Pre-procedure details:    Skin preparation:  Betadine Anesthesia (see MAR for exact dosages):    Anesthesia method:  Local infiltration   Local anesthetic:  Lidocaine 2% WITH epi Procedure type:    Complexity:  Simple Procedure details:    Needle aspiration: no     Incision types:  Single straight   Incision depth:  Dermal   Scalpel blade:  11   Wound management:  Probed and deloculated   Drainage:  Bloody and purulent    Drainage amount:  Moderate   Wound treatment:  Wound left open   Packing materials:  1/4 in gauze Post-procedure details:    Patient tolerance of procedure:  Tolerated well, no immediate complications   (including critical care time)  Medications Ordered in ED Medications  lidocaine-EPINEPHrine (XYLOCAINE W/EPI) 2 %-1:200000 (PF) injection 10 mL (10 mLs Intradermal Given 05/07/18 1309)     Initial Impression / Assessment and Plan / ED Course  I have reviewed the triage vital signs and the nursing notes.  Pertinent labs & imaging results that were available during my care of the patient were reviewed by me and considered in my medical decision making (see chart for details).     Patient with right axillary abscess, incised and drained.  Packing in place.  Otherwise nontoxic, afebrile.  Home with warm compresses, packing removal in 2 days.  I do not think she needs any antibiotics at this time.  Return precautions discussed.  Vitals:   05/07/18 1149  BP: 125/63  Pulse: 74  Resp: 18  Temp: 98.2 F (36.8 C)  TempSrc: Oral  SpO2: 99%  Weight: 95.3 kg (210 lb)  Height: 5\' 5"  (1.651 m)    Final Clinical Impressions(s) / ED Diagnoses   Final diagnoses:  Abscess of axilla, right    ED Discharge Orders    None       Jaynie CrumbleKirichenko, Emmalin Jaquess, PA-C 05/07/18 1352    Tilden Fossaees, Elizabeth, MD 05/08/18 [phone]

## 2018-05-07 NOTE — ED Triage Notes (Signed)
abcess to R axilla

## 2018-05-07 NOTE — Discharge Instructions (Addendum)
Warm compresses to the area.  Pull packing out in 2 days.  Topical antibiotic ointment.  Follow-up with primary care doctor in 2 days or return here if not improving or worsening.

## 2021-02-24 ENCOUNTER — Other Ambulatory Visit: Payer: Self-pay

## 2021-02-24 ENCOUNTER — Emergency Department (HOSPITAL_BASED_OUTPATIENT_CLINIC_OR_DEPARTMENT_OTHER)
Admission: EM | Admit: 2021-02-24 | Discharge: 2021-02-24 | Disposition: A | Discharge status: Home / Self Care | Payer: Medicaid Other | Attending: Emergency Medicine | Admitting: Emergency Medicine

## 2021-02-24 ENCOUNTER — Emergency Department (HOSPITAL_BASED_OUTPATIENT_CLINIC_OR_DEPARTMENT_OTHER): Payer: Medicaid Other

## 2021-02-24 ENCOUNTER — Encounter (HOSPITAL_BASED_OUTPATIENT_CLINIC_OR_DEPARTMENT_OTHER): Payer: Self-pay

## 2021-02-24 DIAGNOSIS — J45909 Unspecified asthma, uncomplicated: Secondary | ICD-10-CM | POA: Insufficient documentation

## 2021-02-24 DIAGNOSIS — F17210 Nicotine dependence, cigarettes, uncomplicated: Secondary | ICD-10-CM | POA: Diagnosis not present

## 2021-02-24 DIAGNOSIS — S99922A Unspecified injury of left foot, initial encounter: Secondary | ICD-10-CM | POA: Diagnosis present

## 2021-02-24 DIAGNOSIS — R600 Localized edema: Secondary | ICD-10-CM | POA: Diagnosis not present

## 2021-02-24 DIAGNOSIS — W108XXA Fall (on) (from) other stairs and steps, initial encounter: Secondary | ICD-10-CM | POA: Insufficient documentation

## 2021-02-24 DIAGNOSIS — S92152A Displaced avulsion fracture (chip fracture) of left talus, initial encounter for closed fracture: Secondary | ICD-10-CM | POA: Insufficient documentation

## 2021-02-24 DIAGNOSIS — W19XXXA Unspecified fall, initial encounter: Secondary | ICD-10-CM

## 2021-02-24 MED ORDER — OXYCODONE-ACETAMINOPHEN 5-325 MG PO TABS: 1.0000 | ORAL_TABLET | Freq: Four times a day (QID) | ORAL | 0 refills | Status: AC | PRN

## 2021-02-24 MED ORDER — IBUPROFEN 400 MG PO TABS
400.0000 mg | ORAL_TABLET | Freq: Once | ORAL | Status: AC | PRN
  Administered 2021-02-24: 400 mg via ORAL
  Filled 2021-02-24: qty 1

## 2021-02-24 NOTE — ED Provider Notes (Signed)
[facility] EMERGENCY DEPARTMENT Provider Note   CSN: [id number] Arrival date & time: 02/24/21  1210     History Chief Complaint  Patient presents with  . Foot Injury    Nicole Nolan is a 25 y.o. female presents today for evaluation of pain in her left foot and ankle.  She had a mechanical nonsyncopal slip and fall on 3 steps.  She denies any other injuries.  She reports significant pain with ambulation.  She did not try anything prior to arrival for her pain.  Her pain is made worse with movement, made better with being still.  No blood thinning medications.  HPI     Past Medical History:  Diagnosis Date  . Asthma     There are no problems to display for this patient.   History reviewed. No pertinent surgical history.   OB History    Gravida  1   Para      Term      Preterm      AB      Living        SAB      IAB      Ectopic      Multiple      Live Births              History reviewed. No pertinent family history.  Social History   Tobacco Use  . Smoking status: Current Every Day Smoker    Types: Cigarettes  . Smokeless tobacco: Never Used  Substance Use Topics  . Alcohol use: Yes    Comment: social   . Drug use: No    Home Medications Prior to Admission medications   Medication Sig Start Date End Date Taking? Authorizing Provider  albuterol (PROVENTIL HFA;VENTOLIN HFA) 108 (90 Base) MCG/ACT inhaler Inhale into the lungs every 6 (six) hours as needed for wheezing or shortness of breath.    [provider]  albuterol (PROVENTIL) (2.5 MG/3ML) 0.083% nebulizer solution Take 2.5 mg by nebulization every 6 (six) hours as needed for wheezing or shortness of breath.    [provider]  ondansetron (ZOFRAN ODT) 8 MG disintegrating tablet Take 1 tablet (8 mg total) by mouth every 8 (eight) hours as needed for nausea or vomiting. 11/04/15   Molpus, John, MD    Allergies    Shellfish allergy  Review of  Systems   Review of Systems  Constitutional: Negative for chills and fever.  Musculoskeletal:       Pain and swelling in left ankle  Skin: Negative for color change, rash and wound.  All other systems reviewed and are negative.   Physical Exam Updated Vital Signs BP 111/66 (BP Location: Right Arm)   Pulse 78   Temp 98.2 F (36.8 C) (Oral)   Resp 16   Ht 5\' 6"  (1.676 m)   Wt 95 kg   LMP 02/17/2021   SpO2 98%   BMI 33.80 kg/m   Physical Exam Vitals and nursing note reviewed.  Constitutional:      General: She is not in acute distress.    Appearance: She is not ill-appearing.  HENT:     Head: Normocephalic.  Cardiovascular:     Rate and Rhythm: Normal rate.     Comments: 2+ left DP pulse, unable to palpate PT pulse secondary to patient pain with attempt. Pulmonary:     Effort: Pulmonary effort is normal. No respiratory distress.  Musculoskeletal:     Comments: Left ankle  with obvious pain and edema.  Tenderness and edema are primarily on the anterior medial aspect.  Patient is able to wiggle her toes.  There is no pain or tenderness to palpation over the proximal left lower leg.  Skin:    Comments: No wounds visualized on left foot or ankle.  Neurological:     Mental Status: She is alert.     Comments: Sensation intact to light touch to left foot.     ED Results / Procedures / Treatments   Labs (all labs ordered are listed, but only abnormal results are displayed) Labs Reviewed - No data to display  EKG None  Radiology DG Ankle Complete Left  Result Date: 02/24/2021 CLINICAL DATA:  Left foot pain status post fall EXAM: LEFT FOOT - COMPLETE 3+ VIEW; LEFT ANKLE COMPLETE - 3+ VIEW COMPARISON:  None. FINDINGS: Left ankle: Curvilinear density along the anterior dorsal talus suspicious for avulsion fracture. Mild soft tissue swelling of the anterior ankle Left foot: Mild spurring of the first metatarsophalangeal joint. No additional fracture or dislocation. IMPRESSION:  Curvilinear density adjacent to the anterior dorsal talus suspicious for avulsion fracture. Electronically Signed   By: Acquanetta Belling M.D.   On: 02/24/2021 13:25   DG Foot Complete Left  Result Date: 02/24/2021 CLINICAL DATA:  Left foot pain status post fall EXAM: LEFT FOOT - COMPLETE 3+ VIEW; LEFT ANKLE COMPLETE - 3+ VIEW COMPARISON:  None. FINDINGS: Left ankle: Curvilinear density along the anterior dorsal talus suspicious for avulsion fracture. Mild soft tissue swelling of the anterior ankle Left foot: Mild spurring of the first metatarsophalangeal joint. No additional fracture or dislocation. IMPRESSION: Curvilinear density adjacent to the anterior dorsal talus suspicious for avulsion fracture. Electronically Signed   By: Acquanetta Belling M.D.   On: 02/24/2021 13:25    Procedures Procedures   Medications Ordered in ED Medications  ibuprofen (ADVIL) tablet 400 mg (400 mg Oral Given 02/24/21 1245)    ED Course  I have reviewed the triage vital signs and the nursing notes.  Pertinent labs & imaging results that were available during my care of the patient were reviewed by me and considered in my medical decision making (see chart for details).    MDM Rules/Calculators/A&P                         Patient is a 25 year old otherwise healthy woman who presents today for evaluation of an isolated injury to her left foot and ankle after she had a mechanical nonsyncopal slip and fall on steps last night.  Denies striking her head or any other injuries. X-rays show a suspicion for a avulsion fracture near the anterior dorsal talus. She is given a cam walker, Ace wrap, and crutches.  She is neurovascularly intact on my exam. We discussed conservative measures for pain control in addition to OTC medication. Given that she has a fracture and based on her degree of swelling [place] PMP is consulted and she is given a prescription for a short course of oxycodone given local Vicodin shortage.  She is  instructed on safe and appropriate use of this medication and states her understanding.  Recommended that she be nonweightbearing until she follows up with orthopedics.  Work note is given.  Return precautions were discussed with patient who states their understanding.  At the time of discharge patient denied any unaddressed complaints or concerns.  Patient is agreeable for discharge home.  Note: Portions of this report  may have been transcribed using voice recognition software. Every effort was made to ensure accuracy; however, inadvertent computerized transcription errors may be present  Final Clinical Impression(s) / ED Diagnoses Final diagnoses:  Fall  Closed avulsion fracture of talus, left, initial encounter    Rx / DC Orders ED Discharge Orders    None       Cristina Gong, PA-C 02/24/21 1453    Horton, Clabe Seal, DO 02/25/21 [phone]

## 2021-02-24 NOTE — ED Triage Notes (Signed)
Pt states she fell down 3 steps last night, c/o pain to left foot. Pain worse with ambulation

## 2021-02-24 NOTE — Discharge Instructions (Addendum)
If you smoke it is very important to help your bone heal that you stop smoking.  It is estimated that for every cigarette that you smoke your bone does not heal for about half an hour.    Please take Ibuprofen (Advil, motrin) and Tylenol (acetaminophen) to relieve your pain.    You may take up to 600 MG (3 pills) of normal strength ibuprofen every 8 hours as needed.   You make take tylenol, up to 1,000 mg (two extra strength pills) every 8 hours as needed.   It is safe to take ibuprofen and tylenol at the same time as they work differently.   Do not take more than 3,000 mg tylenol in a 24 hour period (not more than one dose every 8 hours.  Please check all medication labels as many medications such as pain and cold medications may contain tylenol.  Do not drink alcohol while taking these medications.  Do not take other NSAID'S while taking ibuprofen (such as aleve or naproxen).  Please take ibuprofen with food to decrease stomach upset.  Please keep your ankle elevated above your heart when ever possible.  Please wear your boot at all times.  Do not put any weight on your leg until you follow up with the bone doctor.   You are being prescribed a medication which may make you sleepy. For 24 hours after one dose please do not drive, operate heavy machinery, care for a small child with out another adult present, or perform any activities that may cause harm to you or someone else if you were to fall asleep or be impaired.

## 2021-02-27 ENCOUNTER — Ambulatory Visit (INDEPENDENT_AMBULATORY_CARE_PROVIDER_SITE_OTHER): Payer: Medicaid Other | Admitting: Family Medicine

## 2021-02-27 ENCOUNTER — Other Ambulatory Visit: Payer: Self-pay

## 2021-02-27 ENCOUNTER — Ambulatory Visit: Payer: Self-pay

## 2021-02-27 ENCOUNTER — Encounter: Payer: Self-pay | Admitting: Family Medicine

## 2021-02-27 VITALS — BP 120/80 | Ht 66.0 in | Wt 209.0 lb

## 2021-02-27 DIAGNOSIS — S92155A Nondisplaced avulsion fracture (chip fracture) of left talus, initial encounter for closed fracture: Secondary | ICD-10-CM

## 2021-02-27 NOTE — Progress Notes (Signed)
  Nicole Nolan - 25 y.o. female MRN [id number]  Date of birth: [birth year]  SUBJECTIVE:  Including CC & ROS.  No chief complaint on file.   Nicole Nolan is a 25 y.o. female that is presenting with left foot pain.  She had a fall down some stairs.  She is unsure of the exact mechanism of how her foot was started.  Since that time she has had pain over the dorsum of the foot and the lateral midfoot..  Independent review of the ankle x-ray from 4/30 shows an avulsion from the dorsal talus.   Review of Systems See HPI   HISTORY: Past Medical, Surgical, Social, and Family History Reviewed & Updated per EMR.   Pertinent Historical Findings include:  Past Medical History:  Diagnosis Date  . Asthma     History reviewed. No pertinent surgical history.  History reviewed. No pertinent family history.  Social History   Socioeconomic History  . Marital status: Single    Spouse name: Not on file  . Number of children: Not on file  . Years of education: Not on file  . Highest education level: Not on file  Occupational History  . Not on file  Tobacco Use  . Smoking status: Current Every Day Smoker    Types: Cigarettes  . Smokeless tobacco: Never Used  Substance and Sexual Activity  . Alcohol use: Yes    Comment: social   . Drug use: No  . Sexual activity: Not on file  Other Topics Concern  . Not on file  Social History Narrative  . Not on file   Social Determinants of Health   Financial Resource Strain: Not on file  Food Insecurity: Not on file  Transportation Needs: Not on file  Physical Activity: Not on file  Stress: Not on file  Social Connections: Not on file  Intimate Partner Violence: Not on file     PHYSICAL EXAM:  VS: BP 120/80 (BP Location: Left Arm, Patient Position: Sitting, Cuff Size: Normal)   Ht 5\' 6"  (1.676 m)   Wt 209 lb (94.8 kg)   LMP 02/17/2021   BMI 33.73 kg/m  Physical Exam Gen: NAD, alert, cooperative with exam,  well-appearing MSK:  Left foot: Swelling and ecchymosis over the dorsum of the foot. Limited range of motion. Neurovascular intact  Limited ultrasound: Left foot:  Hyperemia at the dorsum of the talus to suggest avulsion fracture. No changes at the distal fibula. Normal-appearing peroneal tendons. No changes at the base of the fifth metatarsal. Increased hyperemia around the cuboid to suggest a sprain.  Summary: Findings consistent with avulsion of the talus.  Ultrasound and interpretation by 02/19/2021, MD    ASSESSMENT & PLAN:   Closed nondisplaced avulsion fracture of left talus Initial injury on 4/30.  Most consistent with avulsion injury.  Also seems to have a sprain of the lateral midfoot. -Counseled on home exercise therapy and supportive care. -Continue cam walker. -Follow-up in 2 weeks.  Can consider physical therapy and imaging.

## 2021-02-27 NOTE — Patient Instructions (Signed)
Nice to meet you Please use ice as needed Please try range of motion movements.  Please continue the boot   Please send me a message in MyChart with any questions or updates.  Please see me back in 2 weeks.   --Dr. Jordan Likes

## 2021-02-27 NOTE — Assessment & Plan Note (Signed)
Initial injury on 4/30.  Most consistent with avulsion injury.  Also seems to have a sprain of the lateral midfoot. -Counseled on home exercise therapy and supportive care. -Continue cam walker. -Follow-up in 2 weeks.  Can consider physical therapy and imaging.

## 2021-03-13 ENCOUNTER — Ambulatory Visit: Payer: Medicaid Other | Admitting: Family Medicine

## 2021-03-13 NOTE — Progress Notes (Deleted)
  Nicole Nolan - 25 y.o. female MRN [id number]  Date of birth: 1996-06-21  SUBJECTIVE:  Including CC & ROS.  No chief complaint on file.   Nicole Nolan is a 25 y.o. female that is  ***.  ***   Review of Systems See HPI   HISTORY: Past Medical, Surgical, Social, and Family History Reviewed & Updated per EMR.   Pertinent Historical Findings include:  Past Medical History:  Diagnosis Date  . Asthma     No past surgical history on file.  No family history on file.  Social History   Socioeconomic History  . Marital status: Single    Spouse name: Not on file  . Number of children: Not on file  . Years of education: Not on file  . Highest education level: Not on file  Occupational History  . Not on file  Tobacco Use  . Smoking status: Current Every Day Smoker    Types: Cigarettes  . Smokeless tobacco: Never Used  Substance and Sexual Activity  . Alcohol use: Yes    Comment: social   . Drug use: No  . Sexual activity: Not on file  Other Topics Concern  . Not on file  Social History Narrative  . Not on file   Social Determinants of Health   Financial Resource Strain: Not on file  Food Insecurity: Not on file  Transportation Needs: Not on file  Physical Activity: Not on file  Stress: Not on file  Social Connections: Not on file  Intimate Partner Violence: Not on file     PHYSICAL EXAM:  VS: LMP 02/17/2021  Physical Exam Gen: NAD, alert, cooperative with exam, well-appearing MSK:  ***      ASSESSMENT & PLAN:   No problem-specific Assessment & Plan notes found for this encounter.

## 2022-11-03 IMAGING — DX DG ANKLE COMPLETE 3+V*L*
3 series · 3 of 3 positions shown · non-contrast
Comparison: None.

CLINICAL DATA: Left foot pain status post fall

EXAM:
LEFT FOOT - COMPLETE 3+ VIEW; LEFT ANKLE COMPLETE - 3+ VIEW

[ankle ap]
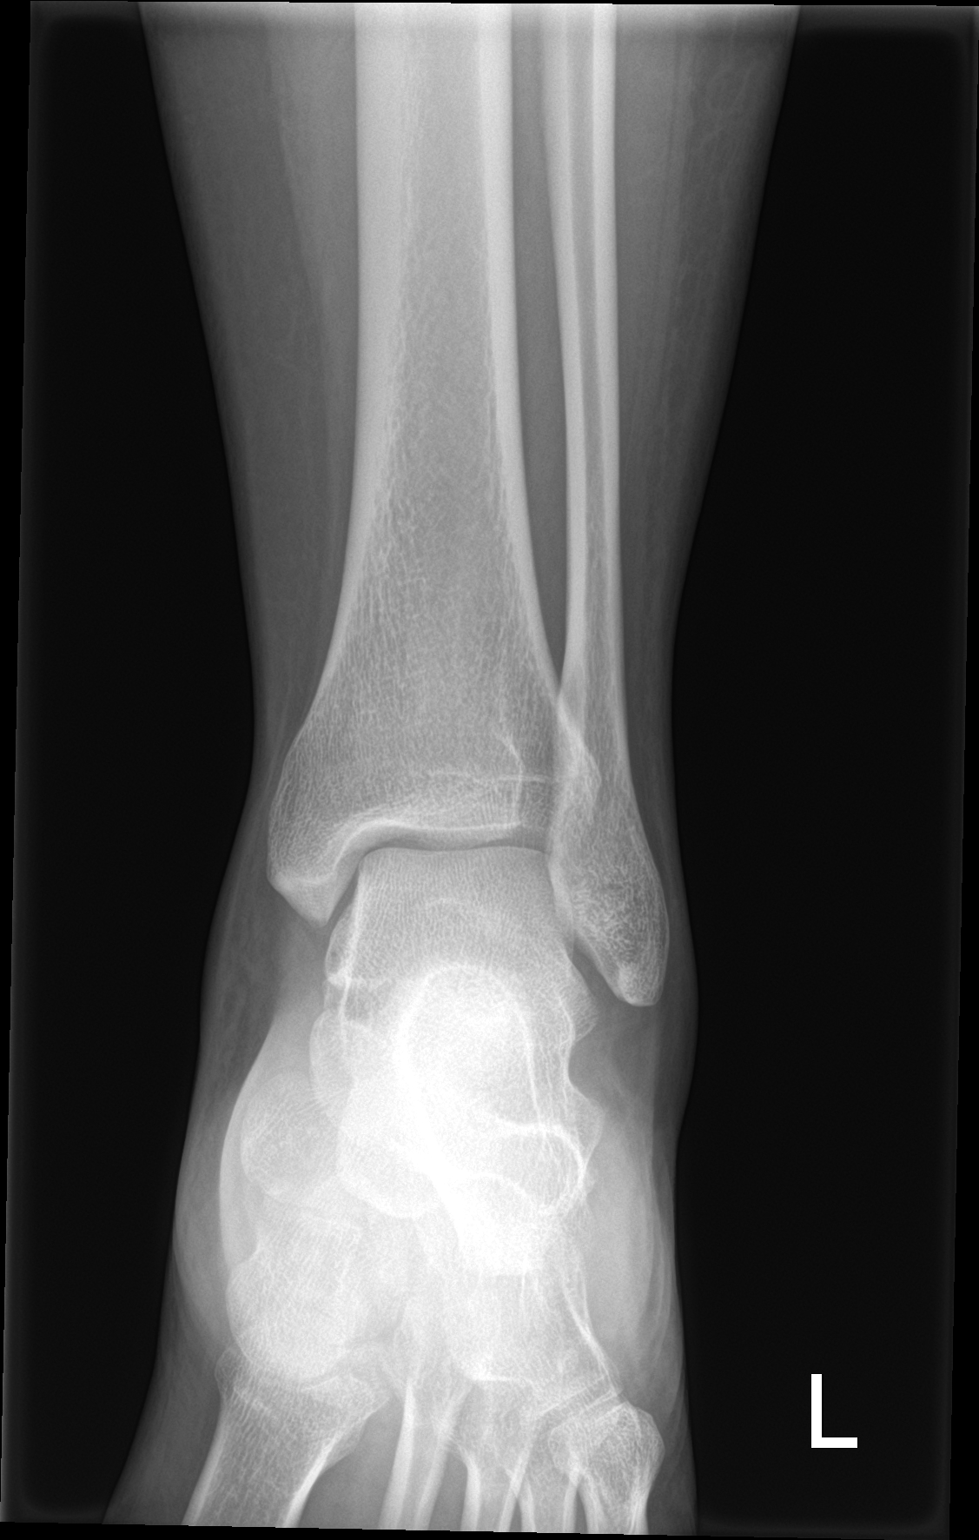

[ankle obl]
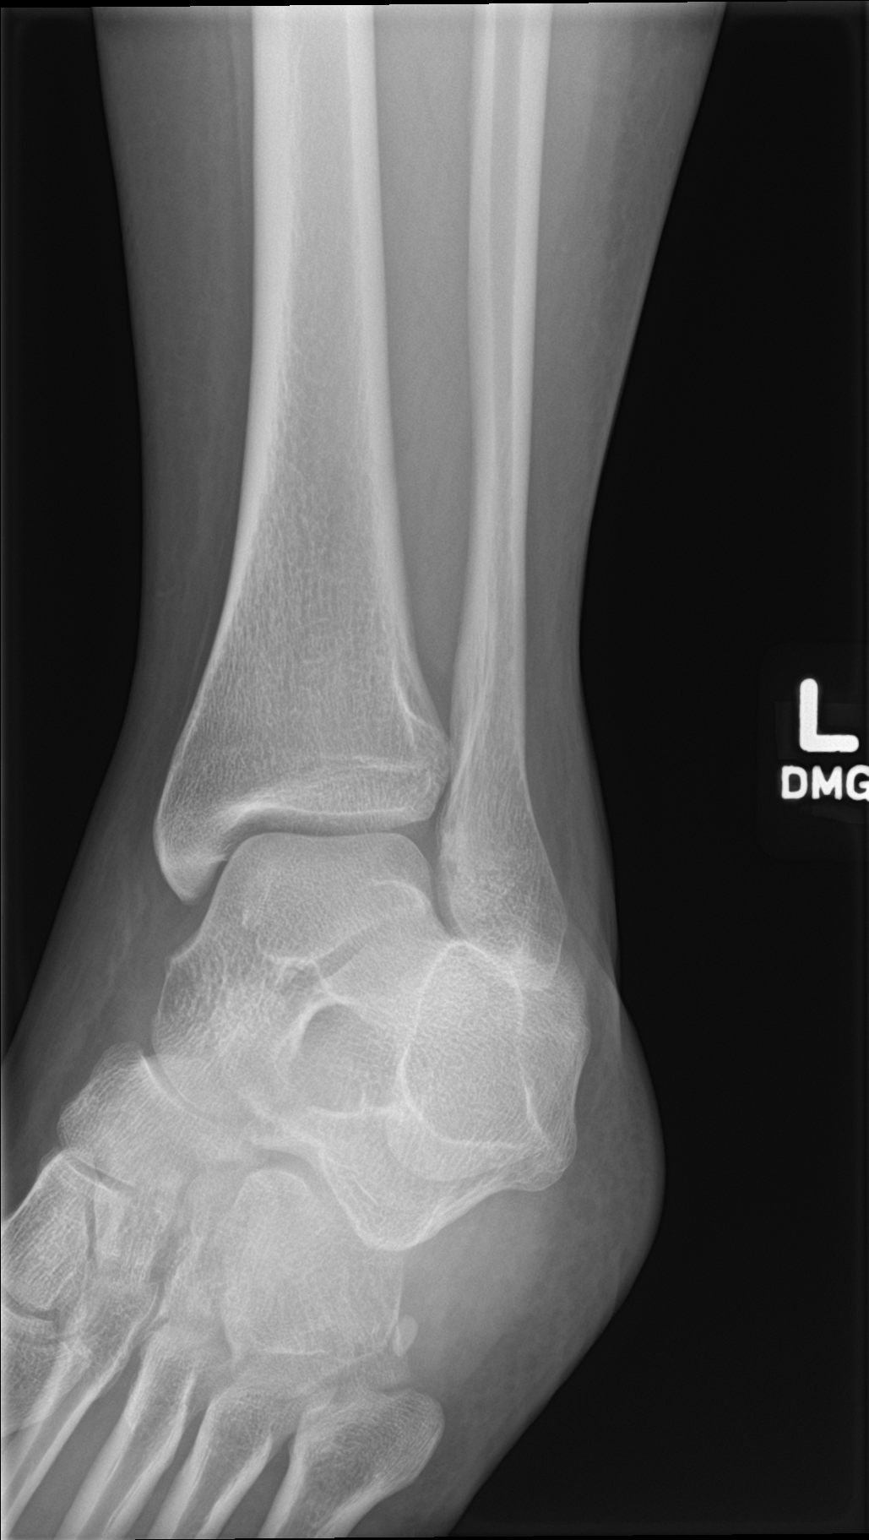

[ankle lat]
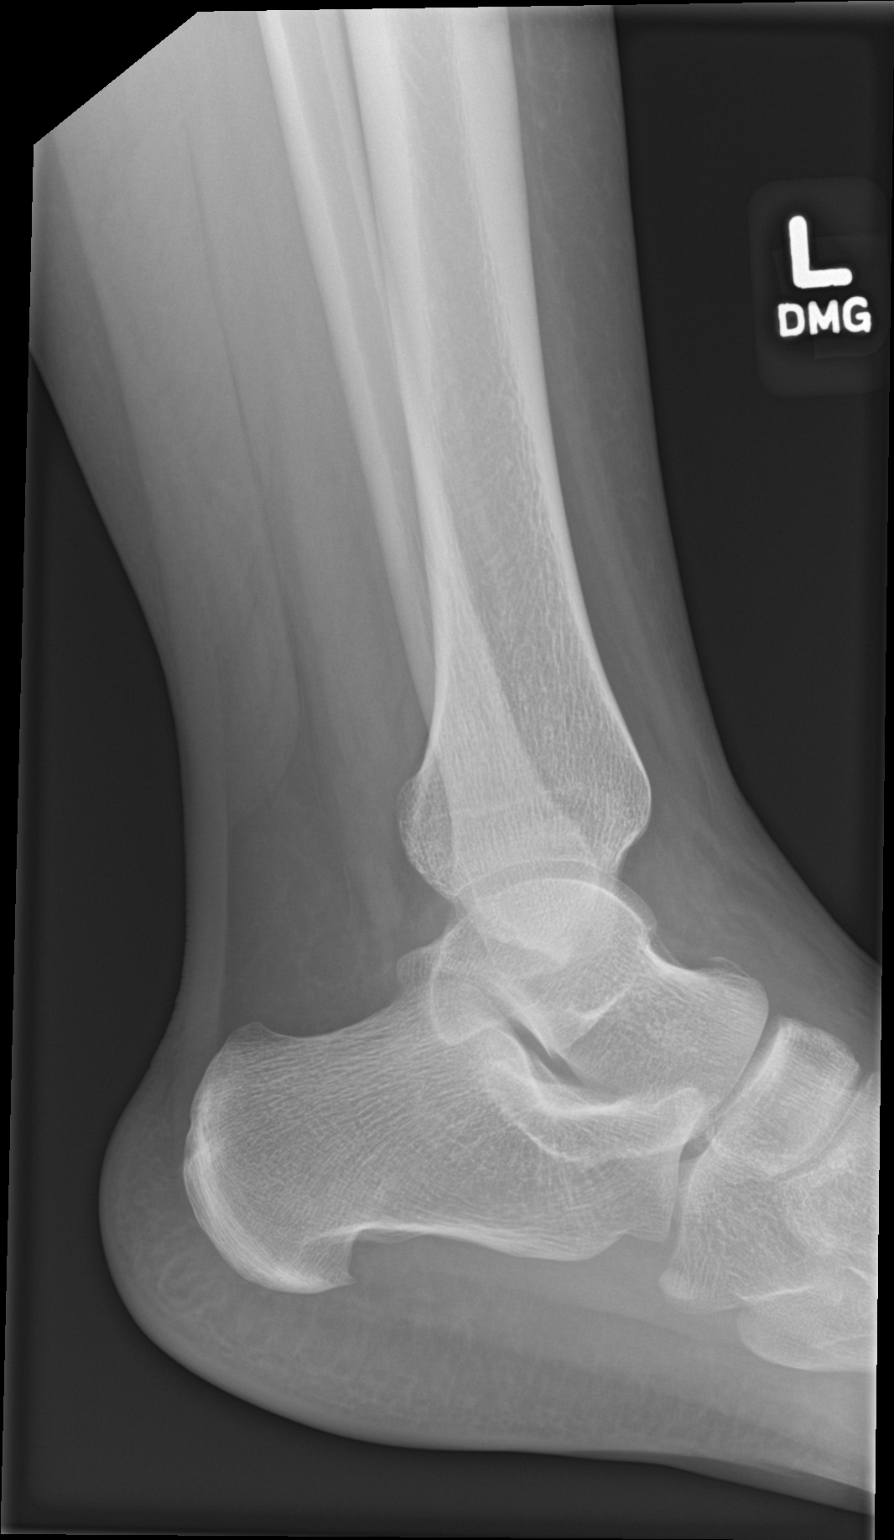

[3 of 3 positions shown; findings below may reference images not displayed]

FINDINGS: Left ankle: Curvilinear density along the anterior dorsal talus
suspicious for avulsion fracture. Mild soft tissue swelling of the
anterior ankle

Left foot: Mild spurring of the first metatarsophalangeal joint. No
additional fracture or dislocation.
IMPRESSION: Curvilinear density adjacent to the anterior dorsal talus suspicious
for avulsion fracture.

## 2022-11-03 IMAGING — DX DG FOOT COMPLETE 3+V*L*
3 series · 3 of 3 positions shown · non-contrast
Comparison: None.

CLINICAL DATA: Left foot pain status post fall

EXAM:
LEFT FOOT - COMPLETE 3+ VIEW; LEFT ANKLE COMPLETE - 3+ VIEW

[foot ap]
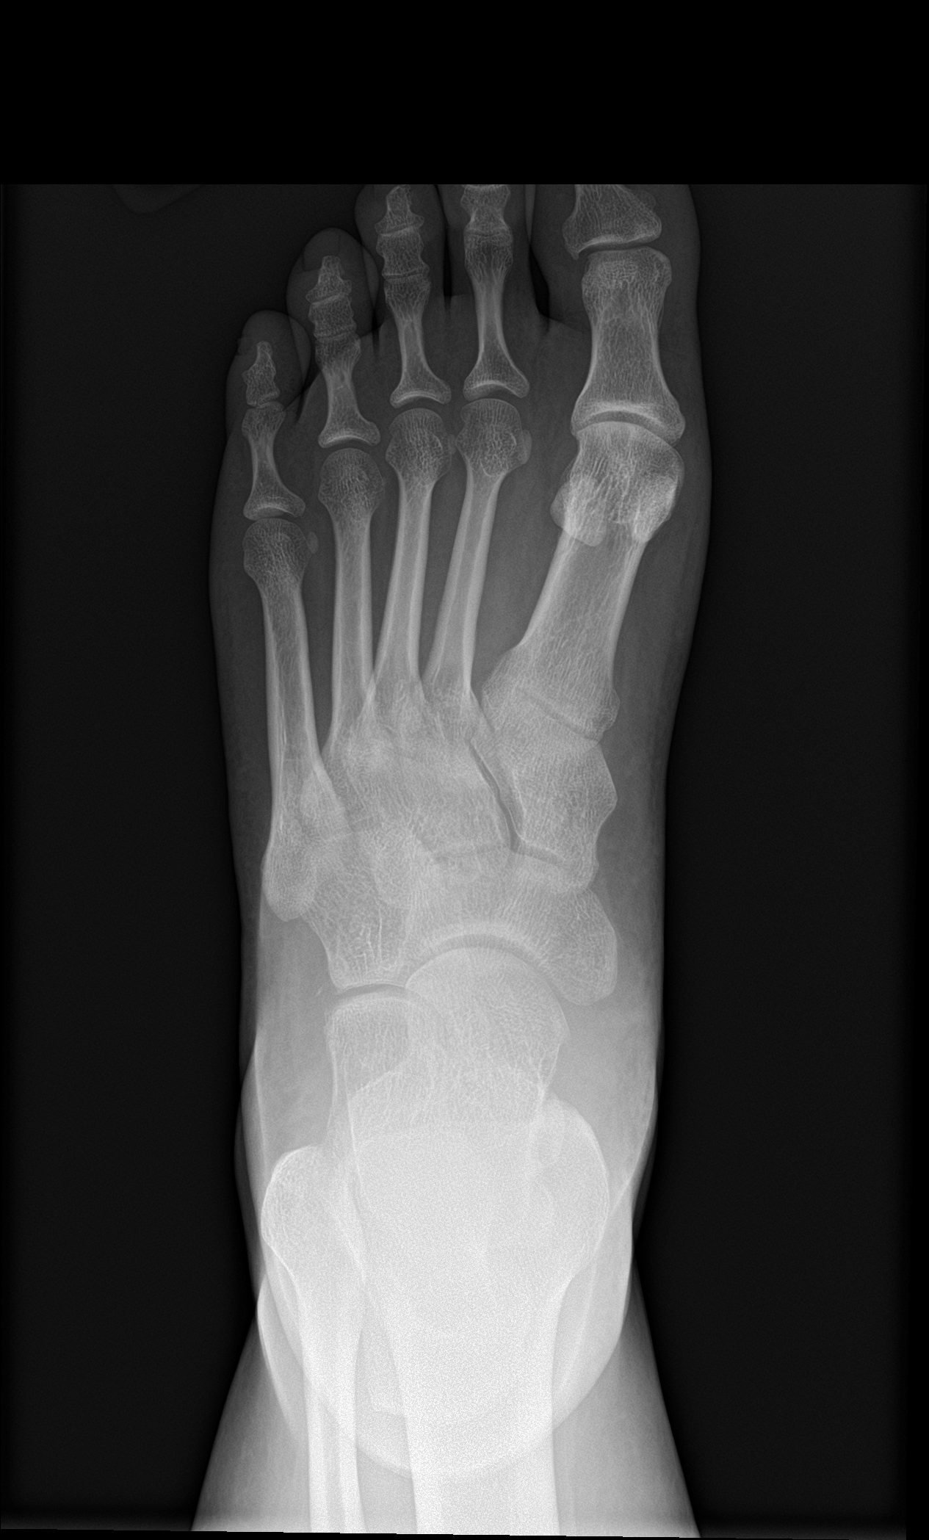

[foot obl]
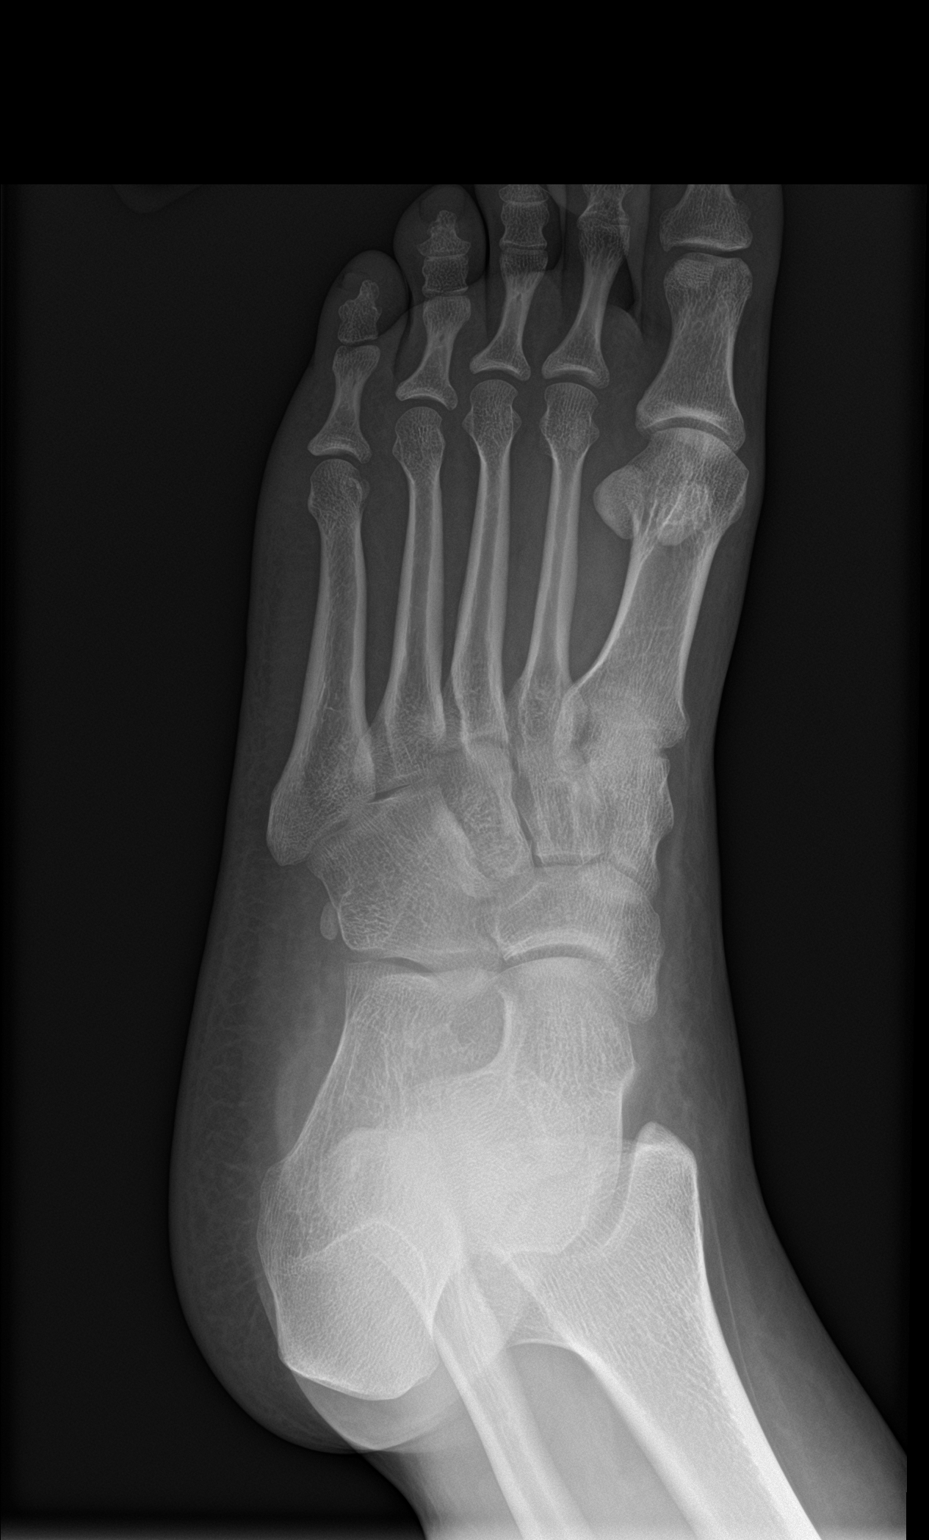

[foot lat]
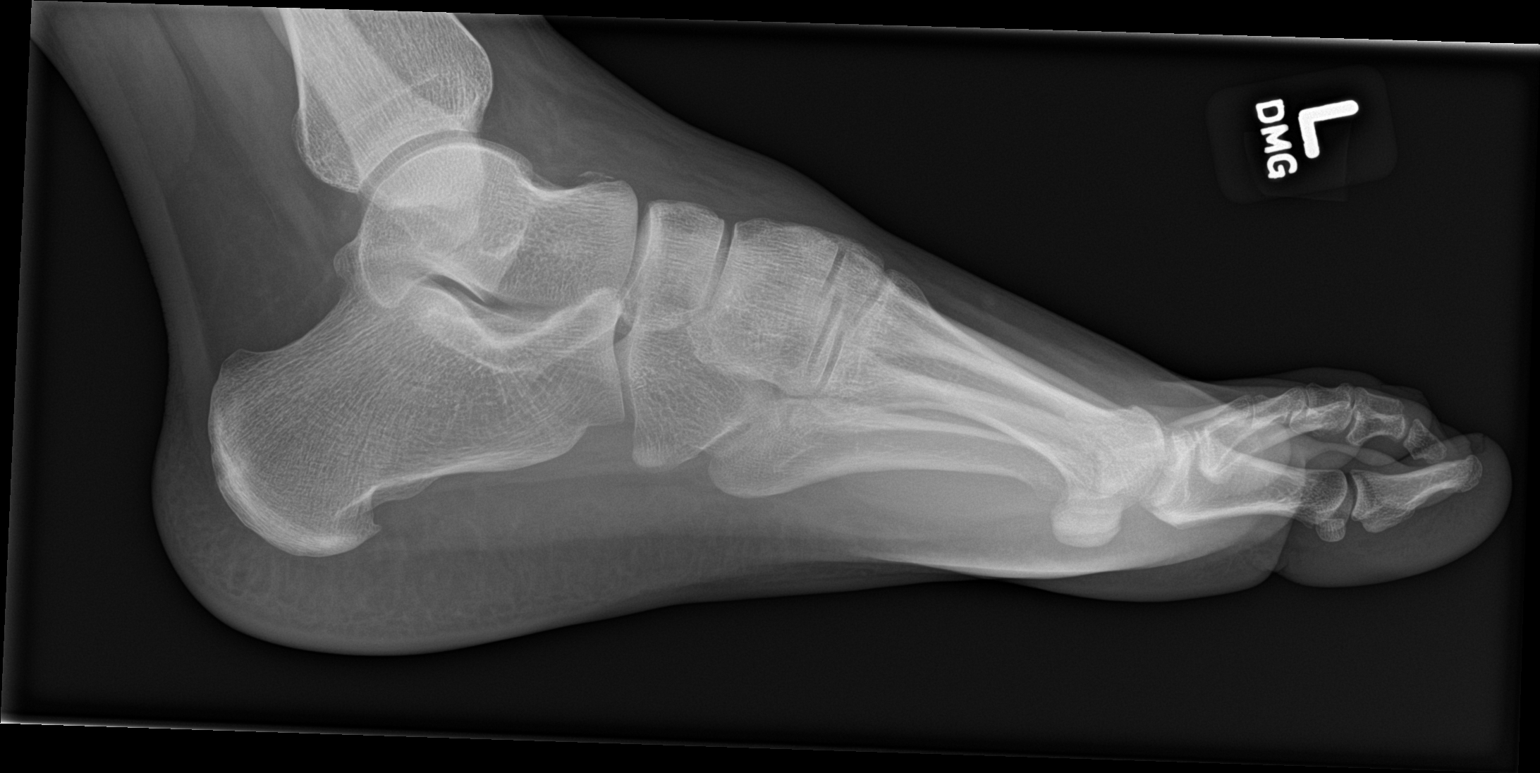

[3 of 3 positions shown; findings below may reference images not displayed]

FINDINGS: Left ankle: Curvilinear density along the anterior dorsal talus
suspicious for avulsion fracture. Mild soft tissue swelling of the
anterior ankle

Left foot: Mild spurring of the first metatarsophalangeal joint. No
additional fracture or dislocation.
IMPRESSION: Curvilinear density adjacent to the anterior dorsal talus suspicious
for avulsion fracture.

## 2023-02-11 ENCOUNTER — Encounter: Payer: Self-pay | Admitting: *Deleted
